# Patient Record
Sex: Female | Born: 1945 | Race: Black or African American | Hispanic: No | Marital: Married | Smoking: Never smoker
Health system: Southern US, Community
[De-identification: ages and names within clinical notes are randomized; demographics above are authoritative.]

## PROBLEM LIST (undated history)

## (undated) DIAGNOSIS — I10 Essential (primary) hypertension: Secondary | ICD-10-CM

## (undated) DIAGNOSIS — E119 Type 2 diabetes mellitus without complications: Secondary | ICD-10-CM

---

## 2017-06-20 ENCOUNTER — Emergency Department (HOSPITAL_COMMUNITY)
Admission: EM | Admit: 2017-06-20 | Discharge: 2017-06-20 | Disposition: A | Discharge status: Home / Self Care | Payer: Medicare Other | Attending: Emergency Medicine | Admitting: Emergency Medicine

## 2017-06-20 ENCOUNTER — Encounter (HOSPITAL_COMMUNITY): Payer: Self-pay

## 2017-06-20 ENCOUNTER — Emergency Department (HOSPITAL_COMMUNITY): Payer: Medicare Other

## 2017-06-20 DIAGNOSIS — M79645 Pain in left finger(s): Secondary | ICD-10-CM | POA: Insufficient documentation

## 2017-06-20 DIAGNOSIS — Y9301 Activity, walking, marching and hiking: Secondary | ICD-10-CM | POA: Insufficient documentation

## 2017-06-20 DIAGNOSIS — S8991XA Unspecified injury of right lower leg, initial encounter: Secondary | ICD-10-CM | POA: Diagnosis present

## 2017-06-20 DIAGNOSIS — Y9289 Other specified places as the place of occurrence of the external cause: Secondary | ICD-10-CM | POA: Diagnosis not present

## 2017-06-20 DIAGNOSIS — M25561 Pain in right knee: Secondary | ICD-10-CM | POA: Diagnosis not present

## 2017-06-20 DIAGNOSIS — Y999 Unspecified external cause status: Secondary | ICD-10-CM | POA: Diagnosis not present

## 2017-06-20 DIAGNOSIS — I10 Essential (primary) hypertension: Secondary | ICD-10-CM | POA: Diagnosis not present

## 2017-06-20 DIAGNOSIS — W1830XA Fall on same level, unspecified, initial encounter: Secondary | ICD-10-CM | POA: Insufficient documentation

## 2017-06-20 DIAGNOSIS — E119 Type 2 diabetes mellitus without complications: Secondary | ICD-10-CM | POA: Diagnosis not present

## 2017-06-20 DIAGNOSIS — W19XXXA Unspecified fall, initial encounter: Secondary | ICD-10-CM

## 2017-06-20 HISTORY — DX: Type 2 diabetes mellitus without complications: E11.9

## 2017-06-20 HISTORY — DX: Essential (primary) hypertension: I10

## 2017-06-20 NOTE — ED Provider Notes (Signed)
[facility]-EMERGENCY DEPT Provider Note   CSN: [id number] Arrival date & time: 06/20/17  1745     History   Chief Complaint Chief Complaint  Patient presents with  . Fall  . Leg Pain    HPI Gail Burgess is a 72 y.o. female.  HPI   Gail Burgess is a 72 y.o. female, with a history of HTN and DM, presenting to the ED with injuries from a slip and fall.  States [place].  Does not know if she hit the back of her head.  Complaining of left index finger pain that she states is minor.  Also complains of right knee pain, throbbing, mild, anterior knee, nonradiating. Denies anticoagulation.  Patient is in town visiting from [place]. Denies LOC, syncope, neuro deficits, CP, SOB, abdominal pain, neck/back pain, headache, dizziness, or any other complaints.     Past Medical History:  Diagnosis Date  . Diabetes mellitus without complication (HCC)   . Hypertension     There are no active problems to display for this patient.     OB History    No data available       Home Medications    Prior to Admission medications   Not on File    Family History History reviewed. No pertinent family history.  Social History Social History   Tobacco Use  . Smoking status: Never Smoker  . Smokeless tobacco: Never Used  Substance Use Topics  . Alcohol use: No    Frequency: Never  . Drug use: No     Allergies   Patient has no allergy information on record.   Review of Systems Review of Systems  Constitutional: Negative for diaphoresis and fever.  Respiratory: Negative for shortness of breath.   Cardiovascular: Negative for chest pain.  Gastrointestinal: Negative for abdominal pain, diarrhea, nausea and vomiting.  Musculoskeletal: Positive for arthralgias. Negative for back pain and neck pain.  Neurological: Negative for dizziness, seizures, syncope, weakness, light-headedness, numbness and headaches.  All other  systems reviewed and are negative.    Physical Exam Updated Vital Signs BP 133/83 (BP Location: Right Arm)   Pulse 82   Temp 98.7 F (37.1 C) (Oral)   Resp 16   SpO2 98%   Physical Exam  Constitutional: She is oriented to person, place, and time. She appears well-developed and well-nourished. No distress.  HENT:  Head: Normocephalic and atraumatic.  Mouth/Throat: Oropharynx is clear and moist.  No tenderness, swelling, instability, deformity, wound, hematoma, or other abnormality noted to the face or head.  Eyes: Conjunctivae and EOM are normal. Pupils are equal, round, and reactive to light.  Neck: Normal range of motion. Neck supple.  Cardiovascular: Normal rate, regular rhythm, normal heart sounds and intact distal pulses.  Pulmonary/Chest: Effort normal and breath sounds normal. No respiratory distress.  Abdominal: Soft. There is no tenderness. There is no guarding.  Musculoskeletal: She exhibits no edema.  Some minor tenderness to the DIP joint of the left index finger.  Full range of motion in each of the joints of the fingers of the left hand.  Minor tenderness to the anterior and lateral right knee.  No swelling, bruising, instability, laxity, crepitus, or deformity noted.  Full range of motion in the right knee.  Normal motor function intact in all extremities and spine. No midline spinal tenderness.   Lymphadenopathy:    She has no cervical adenopathy.  Neurological: She is alert and oriented to  person, place, and time.  No sensory deficits.  No noted speech deficits. No aphasia. Patient handles oral secretions without difficulty. No noted swallowing defects.  Equal grip strength bilaterally. Strength 5/5 in the upper extremities. Strength 5/5 with flexion and extension of the hips, knees, and ankles bilaterally.  Negative Romberg. No gait disturbance.  Coordination intact including heel to shin and finger to nose.  Cranial nerves III-XII grossly intact.  No facial  droop.   Skin: Skin is warm and dry. She is not diaphoretic.  Psychiatric: She has a normal mood and affect. Her behavior is normal.  Nursing note and vitals reviewed.    ED Treatments / Results  Labs (all labs ordered are listed, but only abnormal results are displayed) Labs Reviewed - No data to display  EKG  EKG Interpretation None       Radiology Ct Head Wo Contrast  Result Date: 06/20/2017 CLINICAL DATA:  Fall. Head trauma, minor, GCS>=13, low clinical risk, initial exam EXAM: CT HEAD WITHOUT CONTRAST TECHNIQUE: Contiguous axial images were obtained from the base of the skull through the vertex without intravenous contrast. COMPARISON:  None. FINDINGS: Brain: No evidence of acute infarction, hemorrhage, hydrocephalus, extra-axial collection, or mass lesion/mass effect. Mild chronic small vessel disease. Vascular:  No hyperdense vessel or other acute findings. Skull: No evidence of fracture or other significant bone abnormality. Sinuses/Orbits:  No acute findings. Other: None. IMPRESSION: No acute intracranial abnormality. Mild chronic small vessel disease. Electronically Signed   By: Myles RosenthalJohn  Stahl M.D.   On: 06/20/2017 19:17   Dg Knee Complete 4 Views Right  Result Date: 06/20/2017 CLINICAL DATA:  Right knee pain. EXAM: RIGHT KNEE - COMPLETE 4+ VIEW COMPARISON:  None FINDINGS: No evidence of fracture, dislocation, or joint effusion. Mild tricompartment joint space narrowing. Sharpening of the tibial spine noted. Soft tissues are unremarkable. No joint effusion. IMPRESSION: 1. No acute findings. 2. Degenerative changes Electronically Signed   By: Signa Kellaylor  Stroud M.D.   On: 06/20/2017 19:15    Procedures Procedures (including critical care time)  Medications Ordered in ED Medications - No data to display   Initial Impression / Assessment and Plan / ED Course  I have reviewed the triage vital signs and the nursing notes.  Pertinent labs & imaging results that were available  during my care of the patient were reviewed by me and considered in my medical decision making (see chart for details).     Patient presents for evaluation following a fall.  Neurovascularly intact.  No acute abnormalities on imaging.  No red flag symptoms.  PCP and orthopedic follow-up. The patient was given instructions for home care as well as return precautions. Patient voices understanding of these instructions, accepts the plan, and is comfortable with discharge.   Findings and plan of care discussed with Cathi RoanJosh Long, MD. Dr. Jacqulyn BathLong personally evaluated and examined this patient.   Vitals:   06/20/17 1808 06/20/17 2009  BP: 139/82 133/83  Pulse: 82 82  Resp: 18 16  Temp: 98.7 F (37.1 C)   TempSrc: Oral   SpO2: 100% 98%     Final Clinical Impressions(s) / ED Diagnoses   Final diagnoses:  Fall, initial encounter  Acute pain of right knee    ED Discharge Orders    None       Concepcion LivingJoy, Shawn C, PA-C 06/20/17 2054    Maia PlanLong, Joshua G, MD 06/21/17 [id number]

## 2017-06-20 NOTE — ED Triage Notes (Signed)
Pt reports rt knee and let point finger pain. Pt was able to stand and transfer from stretcher to chair

## 2017-06-20 NOTE — ED Triage Notes (Signed)
[place] pt [place] water and fell causing her rt leg pain and left hand, post fall pt was acting lethargy per EMS. Pt and spouse deny LOC, and hitting her head. Pt has HX of HTN. [facility]   V/s 156/83 HR 83, RR 16  cbg 162

## 2017-06-20 NOTE — Discharge Instructions (Signed)
There were no acute abnormalities on the CT of the head.  You have been seen today for a knee injury. There were no acute abnormalities on the x-rays, including no sign of fracture or dislocation, however, there could be injuries to the soft tissues, such as the ligaments or tendons that are not seen on xrays. There could also be what are called occult fractures that are small fractures not seen on xray. Pain: Ibuprofen, naproxen, or Tylenol Ice: May apply ice to the area over the next 24 hours for 15 minutes at a time to reduce swelling. Elevation: Keep the extremity elevated as often as possible to reduce pain and inflammation. Exercises: Start by performing these exercises a few times a week, increasing the frequency until you are performing them twice daily.  Follow up: If symptoms are improving, you may follow up with your primary care provider for any continued management. If symptoms are not improving, you may follow up with the orthopedic specialist.

## 2019-04-12 IMAGING — CT CT HEAD W/O CM
3 series · 16 of 47 positions shown, 19 images · non-contrast
Comparison: None.

CLINICAL DATA: Fall. Head trauma, minor, GCS>=13, low clinical
risk, initial exam

EXAM:
CT HEAD WITHOUT CONTRAST
TECHNIQUE: Contiguous axial images were obtained from the base of the skull
through the vertex without intravenous contrast.

[Series 2: head wo · axial · 0.47mm/px · z∈[+1485,+1615]mm · 10 of 32 slices shown, 13 images]
[im 3/32  brain]
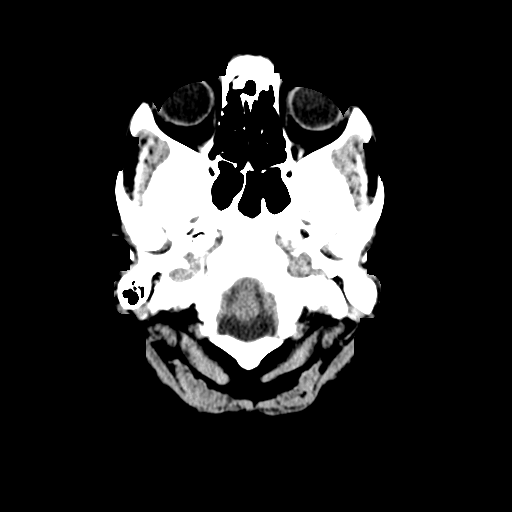
[im 3/32  bone]
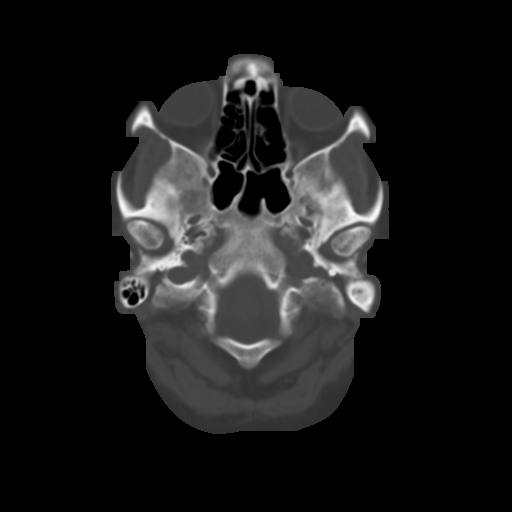
[im 6/32  brain]
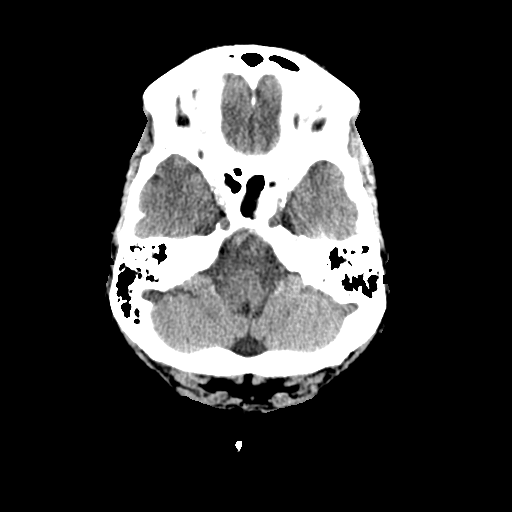
[im 9/32  brain]
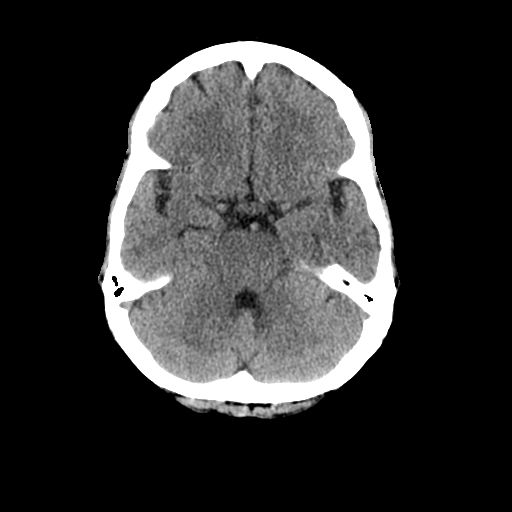
[im 11/32  brain]
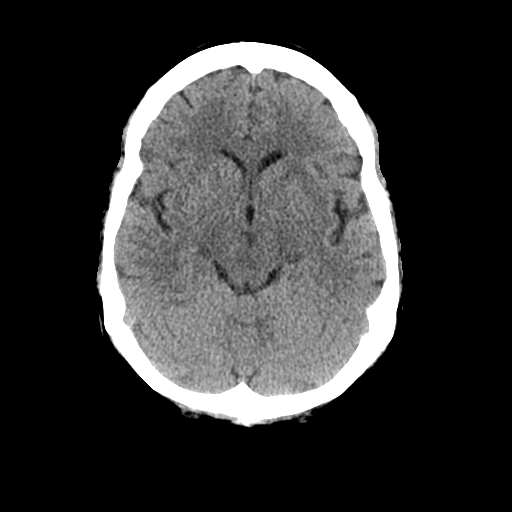
[im 14/32  brain]
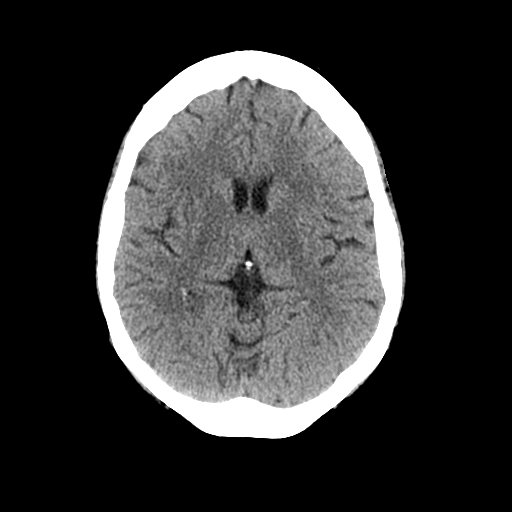
[im 14/32  bone]
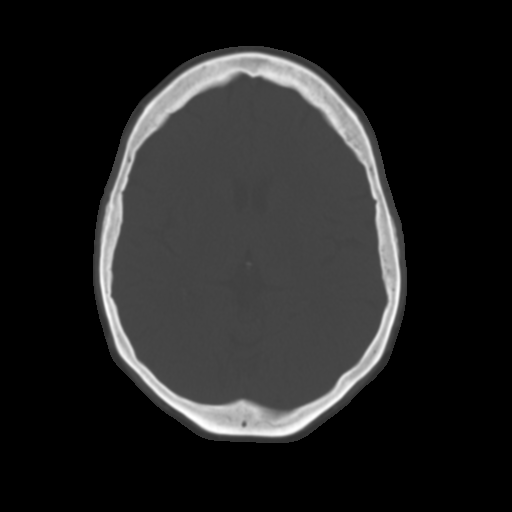
[im 18/32  brain]
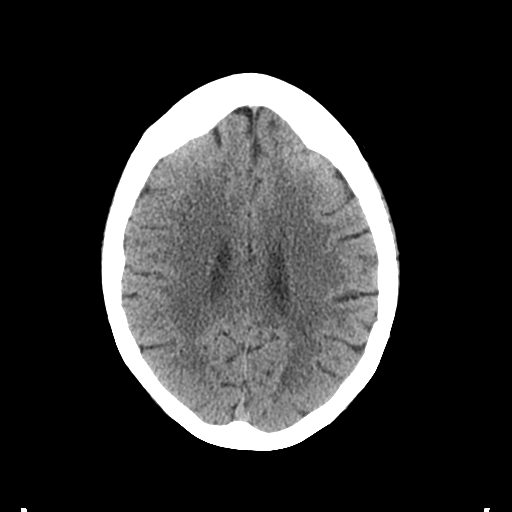
[im 21/32  brain]
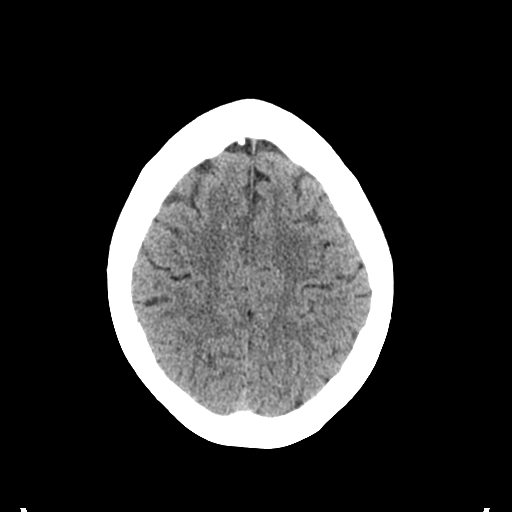
[im 24/32  brain]
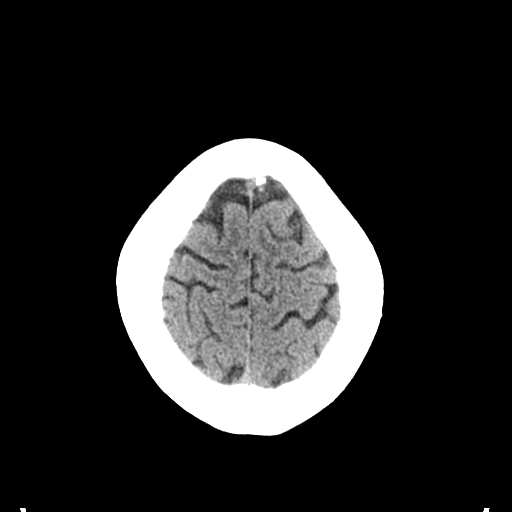
[im 26/32  brain]
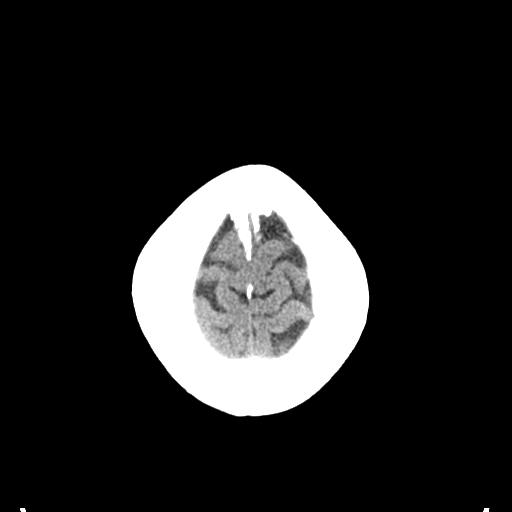
[im 26/32  bone]
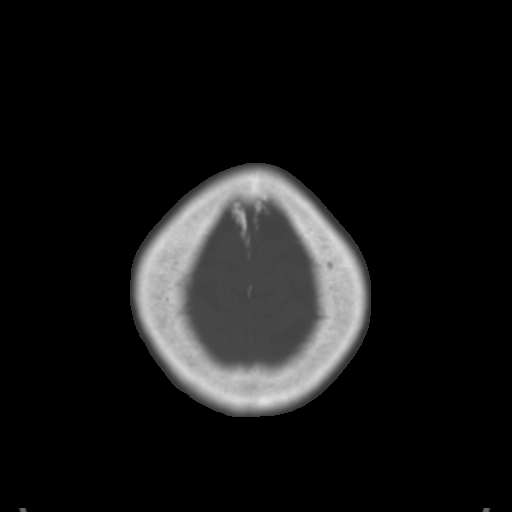
[im 29/32  brain]
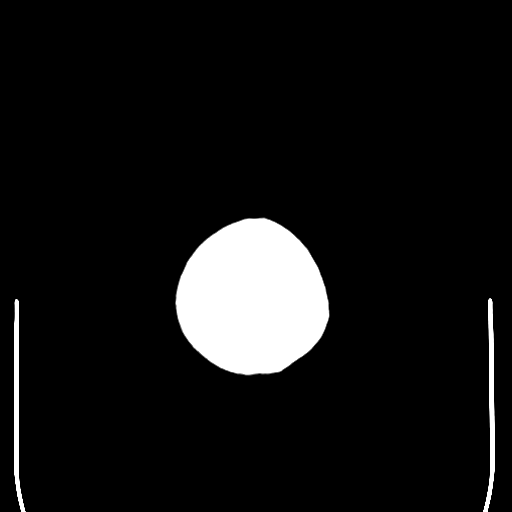

[Series 4: coronal soft tissue · coronal · 0.29mm/px · 3 of 64 slices shown]
[im 22/64  brain]
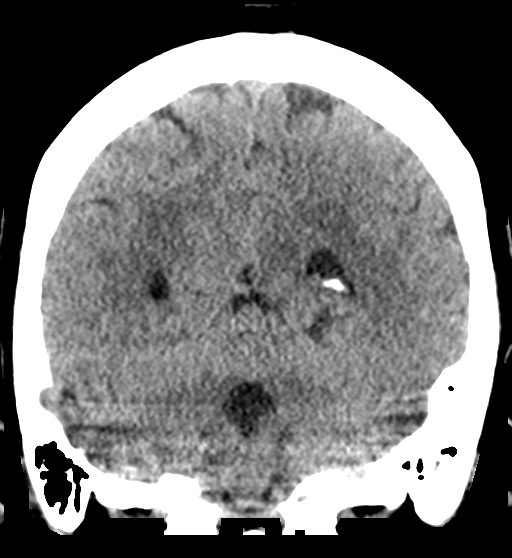
[im 29/64  brain]
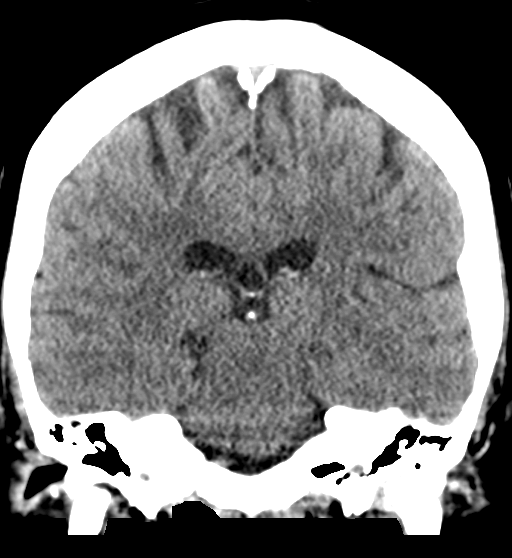
[im 36/64  brain]
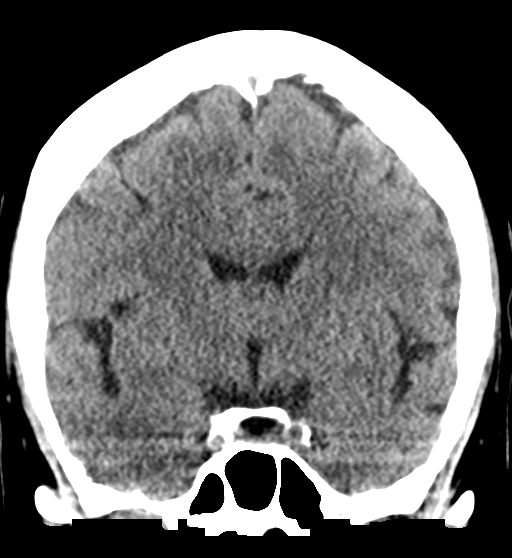

[Series 5: sagittal soft tissue · sagittal · 0.31mm/px · 3 of 49 slices shown]
[im 17/49  brain]
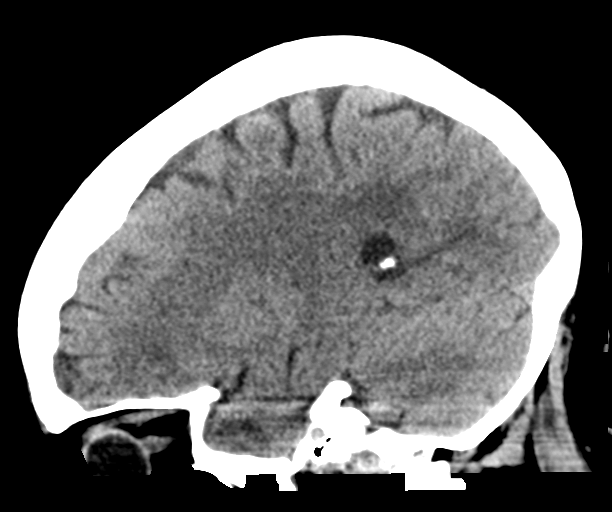
[im 25/49  brain]
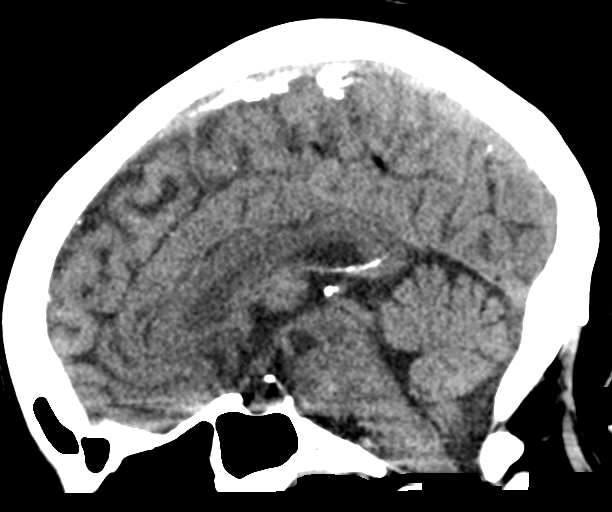
[im 33/49  brain]
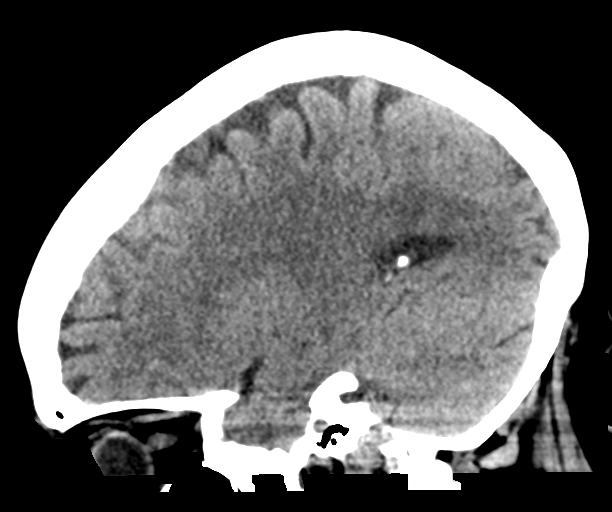

[16 of 47 positions shown; findings below may reference images not displayed]

FINDINGS: Brain: No evidence of acute infarction, hemorrhage, hydrocephalus,
extra-axial collection, or mass lesion/mass effect. Mild chronic
small vessel disease.

Vascular:  No hyperdense vessel or other acute findings.

Skull: No evidence of fracture or other significant bone
abnormality.

Sinuses/Orbits:  No acute findings.

Other: None.
IMPRESSION: No acute intracranial abnormality. Mild chronic small vessel
disease.
